# Patient Record
Sex: Female | Born: 1950 | Race: White | Hispanic: No | Marital: Married
Health system: Southern US, Community
[De-identification: ages and names within clinical notes are randomized; demographics above are authoritative.]

## PROBLEM LIST (undated history)

## (undated) HISTORY — PX: ABDOMINAL HYSTERECTOMY: SHX81

---

## 2004-04-08 HISTORY — PX: BREAST CYST ASPIRATION: SHX578

## 2005-08-16 ENCOUNTER — Ambulatory Visit: Payer: Self-pay | Admitting: Internal Medicine

## 2005-08-22 ENCOUNTER — Ambulatory Visit: Payer: Self-pay | Admitting: Internal Medicine

## 2007-03-26 ENCOUNTER — Ambulatory Visit: Payer: Self-pay | Admitting: Internal Medicine

## 2008-07-19 ENCOUNTER — Ambulatory Visit: Payer: Self-pay | Admitting: Internal Medicine

## 2009-07-20 ENCOUNTER — Ambulatory Visit: Payer: Self-pay | Admitting: Internal Medicine

## 2009-07-26 ENCOUNTER — Ambulatory Visit: Payer: Self-pay | Admitting: Internal Medicine

## 2009-10-12 ENCOUNTER — Ambulatory Visit: Payer: Self-pay | Admitting: Internal Medicine

## 2009-11-02 ENCOUNTER — Ambulatory Visit: Payer: Self-pay | Admitting: Vascular Surgery

## 2010-03-28 ENCOUNTER — Ambulatory Visit: Payer: Self-pay | Admitting: Vascular Surgery

## 2010-08-21 NOTE — Procedures (Signed)
CAROTID DUPLEX EXAM   INDICATION:  Followup known carotid artery disease.   HISTORY:  Diabetes:  No.  Cardiac:  No.  Hypertension:  Yes.  Smoking:  No.  Previous Surgery:  No.  CV History:  No.  Amaurosis Fugax No, Paresthesias No, Hemiparesis No                                       RIGHT             LEFT  Brachial systolic pressure:         175               170  Brachial Doppler waveforms:         WNL               WNL  Vertebral direction of flow:        Antegrade         Antegrade  DUPLEX VELOCITIES (cm/sec)  CCA peak systolic                   91                81  ECA peak systolic                   87                90  ICA peak systolic                   134 distal        182  ICA end diastolic                   47                76  PLAQUE MORPHOLOGY:                  Heterogeneous     Heterogeneous  PLAQUE AMOUNT:                      Mild              Moderate  PLAQUE LOCATION:                    ICA               ICA   IMPRESSION:  1. Right internal carotid artery suggests 40% to 59% stenosis.  2. Left internal carotid artery suggests 60% to 79% stenosis (low end      of range).  3. Antegrade flow in bilateral vertebrals.   ___________________________________________  Di Kindle. Edilia Bo, M.D.   CB/MEDQ  D:  11/02/2009  T:  11/03/2009  Job:  [id number]

## 2010-08-21 NOTE — Procedures (Signed)
CAROTID DUPLEX EXAM   INDICATION:  Follow up carotid artery disease.   HISTORY:  Diabetes:  No.  Cardiac:  No.  Hypertension:  Yes.  Smoking:  Yes.  Previous Surgery:  No.  CV History:  Currently asymptomatic.  Amaurosis Fugax No, Paresthesias No, Hemiparesis No.                                       RIGHT             LEFT  Brachial systolic pressure:         172               174  Brachial Doppler waveforms:         WNL               WNL  Vertebral direction of flow:        Antegrade         Antegrade  DUPLEX VELOCITIES (cm/sec)  CCA peak systolic                   83                71  ECA peak systolic                   104               99  ICA peak systolic                   66                126  ICA end diastolic                   21                45  PLAQUE MORPHOLOGY:                  Heterogenous      Heterogenous  PLAQUE AMOUNT:                      Mild              Mild  PLAQUE LOCATION:                    ICA               ICA, ECA   IMPRESSION:  1. Right side is no hemodynamically significant stenosis.  2. Left side is 40% to 59% stenosis in the internal carotid artery.  3. Bilateral intimal thickening within the common carotid arteries.  4. Please note that velocities not as elevated today as on previous      examinations.   ___________________________________________  Di Kindle. Edilia Bo, M.D.   OD/MEDQ  D:  03/28/2010  T:  03/28/2010  Job:  [id number]

## 2010-08-21 NOTE — Consult Note (Signed)
VASCULAR SURGERY CONSULTATION   Opdahl, Audrianna A.  DOB:  [birth year]                                       11/02/2009  [id number]#:[id number]   I saw the patient in the office today in consultation concerning her  carotid disease.  She was referred by Dr. Clearance Coots.  This is a pleasant 60-  year-old right-handed woman who on July 1 had the sudden onset of a  brief episode of dizziness and also of visual disturbance which she  describes as like her field of vision was vibrating.  These symptoms  lasted briefly.  She noted on July 2 she had an episode where she was  speaking on the phone and had some problem getting her words out.  She  has had no focal weakness or paresthesias.  She has had no history of  amaurosis fugax.  Her workup for this included a carotid duplex scan  done on 10/16/2009 which showed greater than 50% left carotid stenosis.  The vertebral arteries were patent with normally directed flow.  Subsequent MR of the brain showed a punctate area of high signal  intensity in the posterior medial right occipital lobe consistent with a  tiny acute infarct in this region.  She was sent for vascular  consultation.   Her past medical history is significant for hypercholesterolemia.  She  denies any history of diabetes, hypertension, history of previous  myocardial infarction, history of congestive heart failure, history of  COPD.  She has had migraines in the past.   PAST SURGICAL HISTORY:  Significant for partial hysterectomy and  cholecystectomy.   SOCIAL HISTORY:  She is married.  She has two children.  She smokes 1-  1/2 packs a day of cigarettes and has been smoking since she was 60  years old.   FAMILY HISTORY:  There is no history of premature cardiovascular  disease.   MEDICATIONS:  1. Her medications are aspirin 81 mg daily.  2. Simvastatin 20 mg p.o. daily.   REVIEW OF SYSTEMS:  GENERAL:  She has had no recent weight loss, weight  gain or  problems with her appetite.  CARDIOVASCULAR:  She has had no chest pain, chest pressure, palpitations  or arrhythmias.  She has had no history of DVT or phlebitis.  She has  had no history of claudication or rest pain.  NEUROLOGIC:  She has had the dizziness as described above and an  occasional headache.  PULMONARY:  She has had some wheezing in the past.  GI, hematologic, GU, ENT, musculoskeletal, psychiatric, integumentary  review of systems is unremarkable and is documented on the medical  history form in her chart.   PHYSICAL EXAMINATION:  General:  This is a pleasant 60 year old woman  who appears her stated age.  Vital signs:  Her blood pressure is 175/97,  heart rate is 69, respiratory rate is 20.  HEENT:  Unremarkable.  Lungs:  Are clear bilaterally to auscultation without rales, rhonchi or  wheezing.  Cardiovascular:  I do not detect any carotid bruits.  She has  a regular rate and rhythm.  She has palpable femoral pulses and warm and  well-perfused feet without ischemic ulcers.  Abdomen:  Soft and  nontender with normal pitched bowel sounds.  No masses are appreciated.  Musculoskeletal:  There are no major deformities or cyanosis.  Neurological:  She  has no focal weakness or paresthesias.  Skin:  There  are no ulcers or rashes.   I did order and independently interpret her carotid duplex scan which  shows a 40% to 59% right carotid stenosis in the distal right internal  carotid artery.  On the left side she has a 60% to 79% stenosis in the  low end of this range.  Her vertebral arteries are patent with normally  directed flow.   I have also reviewed her MRI which shows the findings described above  which are fairly subtle.   The carotid duplex scan really shows only moderate disease bilaterally.  It is possible that the small right occipital lobe stroke is related to  the mild right carotid stenosis and it is also possible that the  expressive aphasia is related to the  moderate left carotid stenosis.  The symptoms were somewhat subtle however and again the stenoses are not  especially tight.  I do not think I would recommend carotid  endarterectomy based on the information I have so far given that the  stenoses are not especially tight and the symptoms were subtle.  I think  the options are either observation and close followup with carotid  duplex scan and continuing her antiplatelet therapy versus proceeding  with a cerebral arteriogram to further assess her carotid disease.  We  have discussed this option.  She does understand there was a 1% risk of  stroke associated with cerebral arteriography but this would help [place]  further evaluate the stenoses.  I do not think a CTA would give adequate  detail.  I have recommended that we obtain a followup carotid duplex  scan in 6 months and only consider carotid endarterectomy if the  stenosis progressed or if she develops new neurologic symptoms.  We have  reviewed the potential symptoms of cerebrovascular disease.  We also  discussed potentially taking Plavix, although I think there was some  concern about the cost for this.  She will take 81 mg of aspirin twice a  day and I will plan on seeing her back in 6 months.  I have ordered her  carotid duplex scan at that time.  If she has any new symptoms she will  let us know.     Di Kindle. Edilia Bo, M.D.  Electronically Signed  CSD/MEDQ  D:  11/02/2009  T:  11/03/2009  Job:  [id number]   cc:   Diona Fanti

## 2010-08-21 NOTE — Assessment & Plan Note (Signed)
OFFICE VISIT   Allison Andrews, Allison Andrews  DOB:  [birth year]                                       03/28/2010  WGNFA#:[id number]   I saw this patient in the office today for continued followup of her  carotid disease.  I had originally seen her in consultation on November 02, 2009.  She had Andrews brief episode on July 1 of Andrews visual disturbance, but  this did not sound like classic amaurosis fugax.  Another episode the  following day with Andrews brief episode of expressive aphasia.  Her carotid  duplex scan in July suggested Andrews moderate 60%-79% left carotid stenosis  in the lower end of that range and Andrews 40%-59% right carotid stenosis.  She had an MRI of the brain which showed Andrews tiny acute infarct in the  medial right occipital lobe.  At that point, given that she had only  moderate disease and her symptoms were not classic for hemispheric  symptoms, I have recommended Andrews follow-up duplex scan in 6 months.  Since  I saw her last, she has had no history of stroke, TIAs, expressive or  receptive aphasia, or amaurosis fugax.  There has been no significant  change in her medical history which is significant only for  hypercholesterolemia.   SOCIAL HISTORY:  She does continue to smoke Andrews pack per day of cigarettes  and she has been smoking since she was 14.   REVIEW OF SYSTEMS:  CARDIOVASCULAR:  She had no chest pain, chest  pressure, palpitations or arrhythmias.  PULMONARY:  She had no productive cough, bronchitis, asthma or wheezing.   PHYSICAL EXAMINATION:  General:  This is Andrews pleasant 60 year old woman  who appears her stated age.  Vital signs:  Her blood pressure is 157/80,  heart rate is 65, saturation 98%.  Lungs:  Clear bilaterally to  auscultation.  Cardiovascular:  I do not detect any carotid bruits.  She  has Andrews regular rate and rhythm.  Both feet are warm and well-perfused  without ischemic ulcers.  She has no significant lower extremity  swelling.  Abdomen:  Soft and  nontender with normal-pitched bowel  sounds.  Neurologic:  She has no focal weakness or paresthesias.   I have independently interpreted her carotid duplex scan which shows  that the stenoses have actually improved bilaterally.  She has Andrews less  than 39% stenosis on the right and 40%-59% left carotid stenosis.  Both  vertebral arteries are patent with normally directed flow and arm  pressures are equal.  I have compared this study to Andrews previous study  which again shows significantly improved velocities.   Given that she is asymptomatic and her velocities are improved, I think  it is safe to extend her followup out to 1 year.  I have ordered Andrews  follow-up carotid duplex scan in 1 year and I will see her back at that  time.  She knows to call sooner if she has problems.  In the meantime,  she knows to continue taking her aspirin.  In addition, we have again  discussed the importance of tobacco cessation.  However, she has been  under Andrews lot of stress at this point and has not had any success with  this.     Di Kindle. Edilia Bo, M.D.  Electronically Signed   CSD/MEDQ  D:  03/28/2010  T:  03/29/2010  Job:  [id number]   cc:   Diona Fanti

## 2012-02-11 ENCOUNTER — Encounter: Payer: Self-pay | Admitting: Vascular Surgery

## 2013-01-21 ENCOUNTER — Ambulatory Visit: Payer: Self-pay | Admitting: Family Medicine

## 2013-01-29 ENCOUNTER — Ambulatory Visit: Payer: Self-pay | Admitting: Family Medicine

## 2013-08-03 ENCOUNTER — Ambulatory Visit: Payer: Self-pay | Admitting: Family Medicine

## 2014-02-07 ENCOUNTER — Ambulatory Visit: Payer: Self-pay | Admitting: Family Medicine

## 2014-07-22 ENCOUNTER — Other Ambulatory Visit: Payer: Self-pay | Admitting: Family Medicine

## 2014-07-22 DIAGNOSIS — N63 Unspecified lump in unspecified breast: Secondary | ICD-10-CM

## 2014-08-11 ENCOUNTER — Ambulatory Visit: Payer: Self-pay

## 2014-08-11 ENCOUNTER — Ambulatory Visit
Admission: RE | Admit: 2014-08-11 | Discharge: 2014-08-11 | Disposition: A | Discharge status: Home / Self Care | Payer: PPO | Source: Ambulatory Visit | Attending: Family Medicine | Admitting: Family Medicine

## 2014-08-11 DIAGNOSIS — N63 Unspecified lump in unspecified breast: Secondary | ICD-10-CM

## 2014-08-11 DIAGNOSIS — R921 Mammographic calcification found on diagnostic imaging of breast: Secondary | ICD-10-CM | POA: Diagnosis not present

## 2015-02-23 ENCOUNTER — Other Ambulatory Visit: Payer: Self-pay | Admitting: Family Medicine

## 2015-02-23 DIAGNOSIS — Z1231 Encounter for screening mammogram for malignant neoplasm of breast: Secondary | ICD-10-CM

## 2015-03-07 ENCOUNTER — Ambulatory Visit
Admission: RE | Admit: 2015-03-07 | Discharge: 2015-03-07 | Disposition: A | Discharge status: Home / Self Care | Payer: PPO | Source: Ambulatory Visit | Attending: Family Medicine | Admitting: Family Medicine

## 2015-03-07 DIAGNOSIS — Z1231 Encounter for screening mammogram for malignant neoplasm of breast: Secondary | ICD-10-CM

## 2015-05-18 DIAGNOSIS — E119 Type 2 diabetes mellitus without complications: Secondary | ICD-10-CM | POA: Diagnosis not present

## 2015-05-18 DIAGNOSIS — H40219 Acute angle-closure glaucoma, unspecified eye: Secondary | ICD-10-CM | POA: Diagnosis not present

## 2015-05-18 DIAGNOSIS — H43399 Other vitreous opacities, unspecified eye: Secondary | ICD-10-CM | POA: Diagnosis not present

## 2015-05-18 DIAGNOSIS — H4011X0 Primary open-angle glaucoma, stage unspecified: Secondary | ICD-10-CM | POA: Diagnosis not present

## 2015-06-16 DIAGNOSIS — E119 Type 2 diabetes mellitus without complications: Secondary | ICD-10-CM | POA: Diagnosis not present

## 2015-06-16 DIAGNOSIS — E782 Mixed hyperlipidemia: Secondary | ICD-10-CM | POA: Diagnosis not present

## 2015-06-21 DIAGNOSIS — H4011X0 Primary open-angle glaucoma, stage unspecified: Secondary | ICD-10-CM | POA: Diagnosis not present

## 2015-06-21 DIAGNOSIS — H40013 Open angle with borderline findings, low risk, bilateral: Secondary | ICD-10-CM | POA: Diagnosis not present

## 2015-06-23 DIAGNOSIS — E119 Type 2 diabetes mellitus without complications: Secondary | ICD-10-CM | POA: Diagnosis not present

## 2015-06-23 DIAGNOSIS — E782 Mixed hyperlipidemia: Secondary | ICD-10-CM | POA: Diagnosis not present

## 2015-06-23 DIAGNOSIS — E6609 Other obesity due to excess calories: Secondary | ICD-10-CM | POA: Diagnosis not present

## 2015-06-23 DIAGNOSIS — M8580 Other specified disorders of bone density and structure, unspecified site: Secondary | ICD-10-CM | POA: Diagnosis not present

## 2015-07-05 DIAGNOSIS — H4011X0 Primary open-angle glaucoma, stage unspecified: Secondary | ICD-10-CM | POA: Diagnosis not present

## 2015-09-20 DIAGNOSIS — H4011X0 Primary open-angle glaucoma, stage unspecified: Secondary | ICD-10-CM | POA: Diagnosis not present

## 2015-10-16 DIAGNOSIS — H40033 Anatomical narrow angle, bilateral: Secondary | ICD-10-CM | POA: Diagnosis not present

## 2015-10-16 DIAGNOSIS — E782 Mixed hyperlipidemia: Secondary | ICD-10-CM | POA: Diagnosis not present

## 2015-10-16 DIAGNOSIS — E119 Type 2 diabetes mellitus without complications: Secondary | ICD-10-CM | POA: Diagnosis not present

## 2015-10-23 DIAGNOSIS — E782 Mixed hyperlipidemia: Secondary | ICD-10-CM | POA: Diagnosis not present

## 2015-10-23 DIAGNOSIS — E119 Type 2 diabetes mellitus without complications: Secondary | ICD-10-CM | POA: Diagnosis not present

## 2015-10-23 DIAGNOSIS — M8580 Other specified disorders of bone density and structure, unspecified site: Secondary | ICD-10-CM | POA: Diagnosis not present

## 2015-10-23 DIAGNOSIS — E6609 Other obesity due to excess calories: Secondary | ICD-10-CM | POA: Diagnosis not present

## 2015-11-13 DIAGNOSIS — H40033 Anatomical narrow angle, bilateral: Secondary | ICD-10-CM | POA: Diagnosis not present

## 2015-11-13 DIAGNOSIS — H40013 Open angle with borderline findings, low risk, bilateral: Secondary | ICD-10-CM | POA: Diagnosis not present

## 2016-01-24 ENCOUNTER — Other Ambulatory Visit: Payer: Self-pay | Admitting: Family Medicine

## 2016-02-13 DIAGNOSIS — H40013 Open angle with borderline findings, low risk, bilateral: Secondary | ICD-10-CM | POA: Diagnosis not present

## 2016-04-12 DIAGNOSIS — E782 Mixed hyperlipidemia: Secondary | ICD-10-CM | POA: Diagnosis not present

## 2016-04-12 DIAGNOSIS — E119 Type 2 diabetes mellitus without complications: Secondary | ICD-10-CM | POA: Diagnosis not present

## 2016-04-19 DIAGNOSIS — E119 Type 2 diabetes mellitus without complications: Secondary | ICD-10-CM | POA: Diagnosis not present

## 2016-04-19 DIAGNOSIS — E6609 Other obesity due to excess calories: Secondary | ICD-10-CM | POA: Diagnosis not present

## 2016-04-19 DIAGNOSIS — G8929 Other chronic pain: Secondary | ICD-10-CM | POA: Diagnosis not present

## 2016-04-19 DIAGNOSIS — E782 Mixed hyperlipidemia: Secondary | ICD-10-CM | POA: Diagnosis not present

## 2016-04-19 DIAGNOSIS — M545 Low back pain: Secondary | ICD-10-CM | POA: Diagnosis not present

## 2016-04-19 DIAGNOSIS — Z0000 Encounter for general adult medical examination without abnormal findings: Secondary | ICD-10-CM | POA: Diagnosis not present

## 2016-04-29 ENCOUNTER — Other Ambulatory Visit: Payer: Self-pay | Admitting: Family Medicine

## 2016-04-29 DIAGNOSIS — Z1231 Encounter for screening mammogram for malignant neoplasm of breast: Secondary | ICD-10-CM

## 2016-05-20 ENCOUNTER — Ambulatory Visit
Admission: RE | Admit: 2016-05-20 | Discharge: 2016-05-20 | Disposition: A | Discharge status: Home / Self Care | Payer: PPO | Source: Ambulatory Visit | Attending: Family Medicine | Admitting: Family Medicine

## 2016-05-20 DIAGNOSIS — Z1231 Encounter for screening mammogram for malignant neoplasm of breast: Secondary | ICD-10-CM

## 2016-05-23 DIAGNOSIS — E119 Type 2 diabetes mellitus without complications: Secondary | ICD-10-CM | POA: Diagnosis not present

## 2016-05-23 DIAGNOSIS — H43399 Other vitreous opacities, unspecified eye: Secondary | ICD-10-CM | POA: Diagnosis not present

## 2016-05-23 DIAGNOSIS — H5203 Hypermetropia, bilateral: Secondary | ICD-10-CM | POA: Diagnosis not present

## 2016-08-23 DIAGNOSIS — L509 Urticaria, unspecified: Secondary | ICD-10-CM | POA: Diagnosis not present

## 2016-11-01 DIAGNOSIS — E119 Type 2 diabetes mellitus without complications: Secondary | ICD-10-CM | POA: Diagnosis not present

## 2016-11-01 DIAGNOSIS — E785 Hyperlipidemia, unspecified: Secondary | ICD-10-CM | POA: Diagnosis not present

## 2016-11-08 DIAGNOSIS — R7303 Prediabetes: Secondary | ICD-10-CM | POA: Diagnosis not present

## 2016-11-08 DIAGNOSIS — E782 Mixed hyperlipidemia: Secondary | ICD-10-CM | POA: Diagnosis not present

## 2016-11-08 DIAGNOSIS — E6609 Other obesity due to excess calories: Secondary | ICD-10-CM | POA: Diagnosis not present

## 2016-12-05 DIAGNOSIS — H43813 Vitreous degeneration, bilateral: Secondary | ICD-10-CM | POA: Diagnosis not present

## 2016-12-05 DIAGNOSIS — E119 Type 2 diabetes mellitus without complications: Secondary | ICD-10-CM | POA: Diagnosis not present

## 2017-01-16 DIAGNOSIS — Z0000 Encounter for general adult medical examination without abnormal findings: Secondary | ICD-10-CM | POA: Diagnosis not present

## 2017-03-06 DIAGNOSIS — H43313 Vitreous membranes and strands, bilateral: Secondary | ICD-10-CM | POA: Diagnosis not present

## 2017-04-17 ENCOUNTER — Other Ambulatory Visit: Payer: Self-pay | Admitting: Family Medicine

## 2017-04-17 DIAGNOSIS — Z1231 Encounter for screening mammogram for malignant neoplasm of breast: Secondary | ICD-10-CM

## 2017-05-08 DIAGNOSIS — R7303 Prediabetes: Secondary | ICD-10-CM | POA: Diagnosis not present

## 2017-05-08 DIAGNOSIS — E782 Mixed hyperlipidemia: Secondary | ICD-10-CM | POA: Diagnosis not present

## 2017-05-22 DIAGNOSIS — Z0000 Encounter for general adult medical examination without abnormal findings: Secondary | ICD-10-CM | POA: Diagnosis not present

## 2017-05-22 DIAGNOSIS — I10 Essential (primary) hypertension: Secondary | ICD-10-CM | POA: Diagnosis not present

## 2017-05-22 DIAGNOSIS — E782 Mixed hyperlipidemia: Secondary | ICD-10-CM | POA: Diagnosis not present

## 2017-05-22 DIAGNOSIS — R7303 Prediabetes: Secondary | ICD-10-CM | POA: Diagnosis not present

## 2017-05-22 DIAGNOSIS — E6609 Other obesity due to excess calories: Secondary | ICD-10-CM | POA: Diagnosis not present

## 2017-05-29 ENCOUNTER — Ambulatory Visit
Admission: RE | Admit: 2017-05-29 | Discharge: 2017-05-29 | Disposition: A | Discharge status: Home / Self Care | Payer: PPO | Source: Ambulatory Visit | Attending: Family Medicine | Admitting: Family Medicine

## 2017-05-29 DIAGNOSIS — Z1231 Encounter for screening mammogram for malignant neoplasm of breast: Secondary | ICD-10-CM | POA: Diagnosis not present

## 2017-06-05 DIAGNOSIS — H4011X0 Primary open-angle glaucoma, stage unspecified: Secondary | ICD-10-CM | POA: Diagnosis not present

## 2017-06-05 DIAGNOSIS — H43399 Other vitreous opacities, unspecified eye: Secondary | ICD-10-CM | POA: Diagnosis not present

## 2017-06-05 DIAGNOSIS — E119 Type 2 diabetes mellitus without complications: Secondary | ICD-10-CM | POA: Diagnosis not present

## 2017-09-04 DIAGNOSIS — H40023 Open angle with borderline findings, high risk, bilateral: Secondary | ICD-10-CM | POA: Diagnosis not present

## 2017-11-12 DIAGNOSIS — R7303 Prediabetes: Secondary | ICD-10-CM | POA: Diagnosis not present

## 2017-11-12 DIAGNOSIS — I10 Essential (primary) hypertension: Secondary | ICD-10-CM | POA: Diagnosis not present

## 2017-11-12 DIAGNOSIS — E782 Mixed hyperlipidemia: Secondary | ICD-10-CM | POA: Diagnosis not present

## 2017-11-19 DIAGNOSIS — R001 Bradycardia, unspecified: Secondary | ICD-10-CM | POA: Diagnosis not present

## 2017-11-19 DIAGNOSIS — I10 Essential (primary) hypertension: Secondary | ICD-10-CM | POA: Diagnosis not present

## 2017-11-19 DIAGNOSIS — E782 Mixed hyperlipidemia: Secondary | ICD-10-CM | POA: Diagnosis not present

## 2017-11-19 DIAGNOSIS — R7303 Prediabetes: Secondary | ICD-10-CM | POA: Diagnosis not present

## 2017-11-19 DIAGNOSIS — E6609 Other obesity due to excess calories: Secondary | ICD-10-CM | POA: Diagnosis not present

## 2017-12-24 DIAGNOSIS — H40003 Preglaucoma, unspecified, bilateral: Secondary | ICD-10-CM | POA: Diagnosis not present

## 2018-03-25 DIAGNOSIS — H40013 Open angle with borderline findings, low risk, bilateral: Secondary | ICD-10-CM | POA: Diagnosis not present

## 2018-05-06 ENCOUNTER — Other Ambulatory Visit: Payer: Self-pay | Admitting: Family Medicine

## 2018-05-06 DIAGNOSIS — Z1231 Encounter for screening mammogram for malignant neoplasm of breast: Secondary | ICD-10-CM

## 2018-06-01 DIAGNOSIS — R7303 Prediabetes: Secondary | ICD-10-CM | POA: Diagnosis not present

## 2018-06-01 DIAGNOSIS — E782 Mixed hyperlipidemia: Secondary | ICD-10-CM | POA: Diagnosis not present

## 2018-06-01 DIAGNOSIS — I10 Essential (primary) hypertension: Secondary | ICD-10-CM | POA: Diagnosis not present

## 2018-06-03 ENCOUNTER — Ambulatory Visit
Admission: RE | Admit: 2018-06-03 | Discharge: 2018-06-03 | Disposition: A | Discharge status: Home / Self Care | Payer: PPO | Source: Ambulatory Visit | Attending: Family Medicine | Admitting: Family Medicine

## 2018-06-03 DIAGNOSIS — Z1231 Encounter for screening mammogram for malignant neoplasm of breast: Secondary | ICD-10-CM | POA: Insufficient documentation

## 2018-07-02 DIAGNOSIS — I10 Essential (primary) hypertension: Secondary | ICD-10-CM | POA: Diagnosis not present

## 2018-07-02 DIAGNOSIS — E6609 Other obesity due to excess calories: Secondary | ICD-10-CM | POA: Diagnosis not present

## 2018-07-02 DIAGNOSIS — E782 Mixed hyperlipidemia: Secondary | ICD-10-CM | POA: Diagnosis not present

## 2018-07-02 DIAGNOSIS — R7303 Prediabetes: Secondary | ICD-10-CM | POA: Diagnosis not present

## 2018-07-02 DIAGNOSIS — Z0000 Encounter for general adult medical examination without abnormal findings: Secondary | ICD-10-CM | POA: Diagnosis not present

## 2018-09-16 DIAGNOSIS — H40003 Preglaucoma, unspecified, bilateral: Secondary | ICD-10-CM | POA: Diagnosis not present

## 2018-12-28 DIAGNOSIS — I10 Essential (primary) hypertension: Secondary | ICD-10-CM | POA: Diagnosis not present

## 2018-12-28 DIAGNOSIS — E782 Mixed hyperlipidemia: Secondary | ICD-10-CM | POA: Diagnosis not present

## 2018-12-28 DIAGNOSIS — R7303 Prediabetes: Secondary | ICD-10-CM | POA: Diagnosis not present

## 2019-01-04 DIAGNOSIS — I10 Essential (primary) hypertension: Secondary | ICD-10-CM | POA: Diagnosis not present

## 2019-01-04 DIAGNOSIS — E6609 Other obesity due to excess calories: Secondary | ICD-10-CM | POA: Diagnosis not present

## 2019-01-04 DIAGNOSIS — E782 Mixed hyperlipidemia: Secondary | ICD-10-CM | POA: Diagnosis not present

## 2019-03-18 DIAGNOSIS — H40013 Open angle with borderline findings, low risk, bilateral: Secondary | ICD-10-CM | POA: Diagnosis not present

## 2019-04-13 DIAGNOSIS — R509 Fever, unspecified: Secondary | ICD-10-CM | POA: Diagnosis not present

## 2019-04-13 DIAGNOSIS — R0789 Other chest pain: Secondary | ICD-10-CM | POA: Diagnosis not present

## 2019-04-13 DIAGNOSIS — J028 Acute pharyngitis due to other specified organisms: Secondary | ICD-10-CM | POA: Diagnosis not present

## 2019-04-13 DIAGNOSIS — B9789 Other viral agents as the cause of diseases classified elsewhere: Secondary | ICD-10-CM | POA: Diagnosis not present

## 2019-04-19 DIAGNOSIS — L509 Urticaria, unspecified: Secondary | ICD-10-CM | POA: Diagnosis not present

## 2019-05-03 DIAGNOSIS — I10 Essential (primary) hypertension: Secondary | ICD-10-CM | POA: Diagnosis not present

## 2019-05-03 DIAGNOSIS — E782 Mixed hyperlipidemia: Secondary | ICD-10-CM | POA: Diagnosis not present

## 2019-05-10 DIAGNOSIS — I10 Essential (primary) hypertension: Secondary | ICD-10-CM | POA: Diagnosis not present

## 2019-05-10 DIAGNOSIS — M654 Radial styloid tenosynovitis [de Quervain]: Secondary | ICD-10-CM | POA: Diagnosis not present

## 2019-05-10 DIAGNOSIS — E782 Mixed hyperlipidemia: Secondary | ICD-10-CM | POA: Diagnosis not present

## 2019-05-10 DIAGNOSIS — E6609 Other obesity due to excess calories: Secondary | ICD-10-CM | POA: Diagnosis not present

## 2019-05-17 ENCOUNTER — Other Ambulatory Visit: Payer: Self-pay | Admitting: Family Medicine

## 2019-05-17 DIAGNOSIS — M654 Radial styloid tenosynovitis [de Quervain]: Secondary | ICD-10-CM | POA: Diagnosis not present

## 2019-05-17 DIAGNOSIS — G8929 Other chronic pain: Secondary | ICD-10-CM | POA: Diagnosis not present

## 2019-05-17 DIAGNOSIS — Z1231 Encounter for screening mammogram for malignant neoplasm of breast: Secondary | ICD-10-CM

## 2019-05-17 DIAGNOSIS — M25532 Pain in left wrist: Secondary | ICD-10-CM | POA: Diagnosis not present

## 2019-06-14 ENCOUNTER — Ambulatory Visit
Admission: RE | Admit: 2019-06-14 | Discharge: 2019-06-14 | Disposition: A | Discharge status: Home / Self Care | Payer: PPO | Source: Ambulatory Visit | Attending: Family Medicine | Admitting: Family Medicine

## 2019-06-14 DIAGNOSIS — Z1231 Encounter for screening mammogram for malignant neoplasm of breast: Secondary | ICD-10-CM | POA: Diagnosis not present

## 2019-07-19 DIAGNOSIS — H40013 Open angle with borderline findings, low risk, bilateral: Secondary | ICD-10-CM | POA: Diagnosis not present

## 2019-09-08 DIAGNOSIS — I10 Essential (primary) hypertension: Secondary | ICD-10-CM | POA: Diagnosis not present

## 2019-09-08 DIAGNOSIS — E782 Mixed hyperlipidemia: Secondary | ICD-10-CM | POA: Diagnosis not present

## 2019-09-13 DIAGNOSIS — I10 Essential (primary) hypertension: Secondary | ICD-10-CM | POA: Diagnosis not present

## 2019-09-13 DIAGNOSIS — E6609 Other obesity due to excess calories: Secondary | ICD-10-CM | POA: Diagnosis not present

## 2019-09-13 DIAGNOSIS — Z0000 Encounter for general adult medical examination without abnormal findings: Secondary | ICD-10-CM | POA: Diagnosis not present

## 2019-09-13 DIAGNOSIS — E782 Mixed hyperlipidemia: Secondary | ICD-10-CM | POA: Diagnosis not present

## 2019-09-20 ENCOUNTER — Telehealth: Payer: Self-pay | Admitting: *Deleted

## 2019-09-20 DIAGNOSIS — Z87891 Personal history of nicotine dependence: Secondary | ICD-10-CM

## 2019-09-20 DIAGNOSIS — Z122 Encounter for screening for malignant neoplasm of respiratory organs: Secondary | ICD-10-CM

## 2019-09-20 NOTE — Telephone Encounter (Signed)
Received referral for initial lung cancer screening scan. Contacted patient and obtained smoking history,(former, quit 2013, 92 pack year) as well as answering questions related to screening process. Patient denies signs of lung cancer such as weight loss or hemoptysis. Patient denies comorbidity that would prevent curative treatment if lung cancer were found. Patient is scheduled for shared decision making visit and CT scan on 10/04/19.

## 2019-09-21 DIAGNOSIS — M545 Low back pain: Secondary | ICD-10-CM | POA: Diagnosis not present

## 2019-09-24 DIAGNOSIS — I10 Essential (primary) hypertension: Secondary | ICD-10-CM | POA: Diagnosis not present

## 2019-09-24 DIAGNOSIS — M545 Low back pain: Secondary | ICD-10-CM | POA: Diagnosis not present

## 2019-10-04 ENCOUNTER — Ambulatory Visit
Admission: RE | Admit: 2019-10-04 | Discharge: 2019-10-04 | Disposition: A | Discharge status: Home / Self Care | Payer: PPO | Source: Ambulatory Visit | Attending: Oncology | Admitting: Oncology

## 2019-10-04 ENCOUNTER — Other Ambulatory Visit: Payer: Self-pay

## 2019-10-04 ENCOUNTER — Inpatient Hospital Stay: Payer: PPO | Attending: Oncology | Admitting: Oncology

## 2019-10-04 DIAGNOSIS — Z87891 Personal history of nicotine dependence: Secondary | ICD-10-CM

## 2019-10-04 DIAGNOSIS — Z122 Encounter for screening for malignant neoplasm of respiratory organs: Secondary | ICD-10-CM

## 2019-10-04 NOTE — Progress Notes (Signed)
Virtual Visit via Video Note  I connected with Allison Andrews on 10/04/19 at  1:00 PM EDT by a video enabled telemedicine application and verified that I am speaking with the correct person using two identifiers.  Location: Patient: Home Provider: Clinic   I discussed the limitations of evaluation and management by telemedicine and the availability of in person appointments. The patient expressed understanding and agreed to proceed.  I discussed the assessment and treatment plan with the patient. The patient was provided an opportunity to ask questions and all were answered. The patient agreed with the plan and demonstrated an understanding of the instructions.   The patient was advised to call back or seek an in-person evaluation if the symptoms worsen or if the condition fails to improve as anticipated.   In accordance with CMS guidelines, patient has met eligibility criteria including age, absence of signs or symptoms of lung cancer.  Social History   Tobacco Use  . Smoking status: Not on file  Substance Use Topics  . Alcohol use: Not on file  . Drug use: Not on file      A shared decision-making session was conducted prior to the performance of CT scan. This includes one or more decision aids, includes benefits and harms of screening, follow-up diagnostic testing, over-diagnosis, false positive rate, and total radiation exposure.   Counseling on the importance of adherence to annual lung cancer LDCT screening, impact of co-morbidities, and ability or willingness to undergo diagnosis and treatment is imperative for compliance of the program.   Counseling on the importance of continued smoking cessation for former smokers; the importance of smoking cessation for current smokers, and information about tobacco cessation interventions have been given to patient including [place] and 1800 quit [place] programs.   Written order for lung cancer screening with LDCT has been given to  the patient and any and all questions have been answered to the best of my abilities.    Yearly follow up will be coordinated by Burgess Estelle, Thoracic Navigator.  I provided 15 minutes of face-to-face video visit time during this encounter, and > 50% was spent counseling as documented under my assessment & plan.   Jacquelin Hawking, NP

## 2019-10-06 ENCOUNTER — Telehealth: Payer: Self-pay | Admitting: *Deleted

## 2019-10-06 NOTE — Telephone Encounter (Signed)
Notified patient of LDCT lung cancer screening program results with recommendation for 3 month follow up imaging. Also notified of incidental findings noted below and is encouraged to discuss further with PCP who will receive a copy of this note and/or the CT report. Patient verbalizes understanding.   IMPRESSION: 1. Lung-RADS 4AS, suspicious. Follow up low-dose chest CT without contrast in 3 months (please use the following order, "CT CHEST LCS NODULE FOLLOW-UP W/O CM") is recommended. Alternatively, PET may be considered when there is a solid component 14mm or larger. 2. The "S" modifier above refers to potentially clinically significant non lung cancer related findings. Specifically, there is aortic atherosclerosis, in addition to left main coronary artery disease. Assessment for potential risk factor modification, dietary therapy or pharmacologic therapy may be warranted, if clinically indicated. 3. Mild diffuse bronchial wall thickening with mild centrilobular and paraseptal emphysema; imaging findings suggestive of underlying COPD. 4. Small amount of pericardial fluid and/or thickening, unlikely to be of hemodynamic significance at this time.  Aortic Atherosclerosis (ICD10-I70.0) and Emphysema (ICD10-J43.9).

## 2019-10-14 DIAGNOSIS — I700 Atherosclerosis of aorta: Secondary | ICD-10-CM | POA: Diagnosis not present

## 2019-10-14 DIAGNOSIS — I2510 Atherosclerotic heart disease of native coronary artery without angina pectoris: Secondary | ICD-10-CM | POA: Diagnosis not present

## 2019-10-14 DIAGNOSIS — J438 Other emphysema: Secondary | ICD-10-CM | POA: Diagnosis not present

## 2019-12-27 ENCOUNTER — Other Ambulatory Visit: Payer: Self-pay | Admitting: *Deleted

## 2019-12-27 DIAGNOSIS — Z87891 Personal history of nicotine dependence: Secondary | ICD-10-CM

## 2019-12-27 DIAGNOSIS — R918 Other nonspecific abnormal finding of lung field: Secondary | ICD-10-CM

## 2019-12-27 NOTE — Progress Notes (Signed)
Contacted and scheduled 

## 2019-12-29 DIAGNOSIS — M545 Low back pain: Secondary | ICD-10-CM | POA: Diagnosis not present

## 2020-01-04 DIAGNOSIS — M545 Low back pain: Secondary | ICD-10-CM | POA: Diagnosis not present

## 2020-01-10 ENCOUNTER — Other Ambulatory Visit: Payer: Self-pay

## 2020-01-10 ENCOUNTER — Ambulatory Visit
Admission: RE | Admit: 2020-01-10 | Discharge: 2020-01-10 | Disposition: A | Discharge status: Home / Self Care | Payer: PPO | Source: Ambulatory Visit | Attending: Oncology | Admitting: Oncology

## 2020-01-10 DIAGNOSIS — I2510 Atherosclerotic heart disease of native coronary artery without angina pectoris: Secondary | ICD-10-CM | POA: Diagnosis not present

## 2020-01-10 DIAGNOSIS — R918 Other nonspecific abnormal finding of lung field: Secondary | ICD-10-CM | POA: Diagnosis not present

## 2020-01-10 DIAGNOSIS — I313 Pericardial effusion (noninflammatory): Secondary | ICD-10-CM | POA: Diagnosis not present

## 2020-01-10 DIAGNOSIS — J432 Centrilobular emphysema: Secondary | ICD-10-CM | POA: Diagnosis not present

## 2020-01-10 DIAGNOSIS — Z87891 Personal history of nicotine dependence: Secondary | ICD-10-CM

## 2020-01-10 DIAGNOSIS — I700 Atherosclerosis of aorta: Secondary | ICD-10-CM | POA: Diagnosis not present

## 2020-01-12 ENCOUNTER — Encounter: Payer: Self-pay | Admitting: *Deleted

## 2020-01-31 DIAGNOSIS — H4011X0 Primary open-angle glaucoma, stage unspecified: Secondary | ICD-10-CM | POA: Diagnosis not present

## 2020-01-31 DIAGNOSIS — H40013 Open angle with borderline findings, low risk, bilateral: Secondary | ICD-10-CM | POA: Diagnosis not present

## 2020-01-31 DIAGNOSIS — H40003 Preglaucoma, unspecified, bilateral: Secondary | ICD-10-CM | POA: Diagnosis not present

## 2020-02-24 IMAGING — MG DIGITAL SCREENING BILATERAL MAMMOGRAM WITH TOMO AND CAD
8 series · 8 of 24 positions shown · non-contrast
Comparison: Previous exam(s).

CLINICAL DATA: Screening.

EXAM:
DIGITAL SCREENING BILATERAL MAMMOGRAM WITH TOMO AND CAD

[L CC synth-2D]
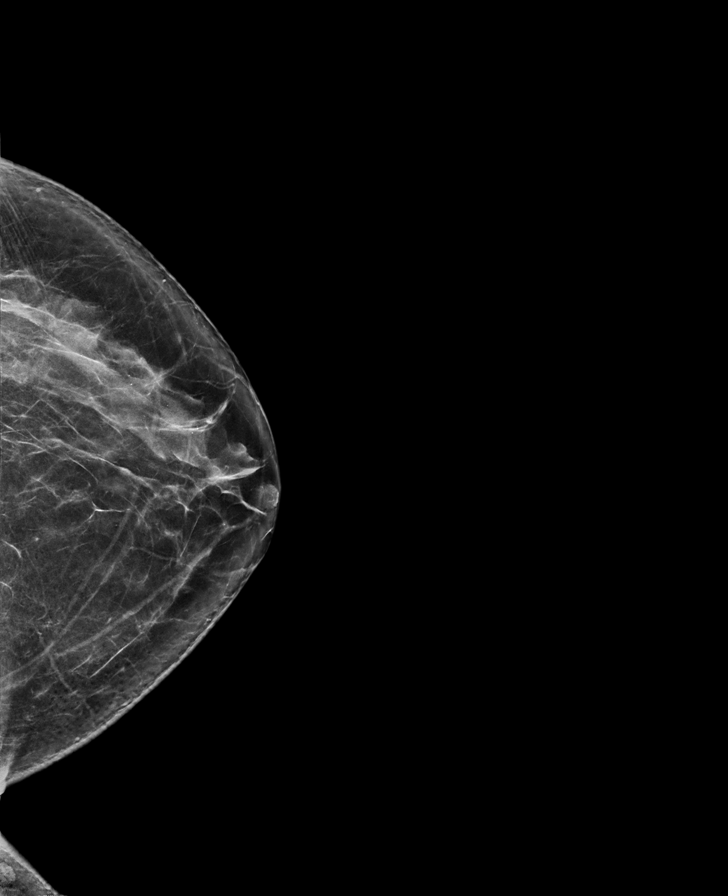

[R MLO synth-2D]
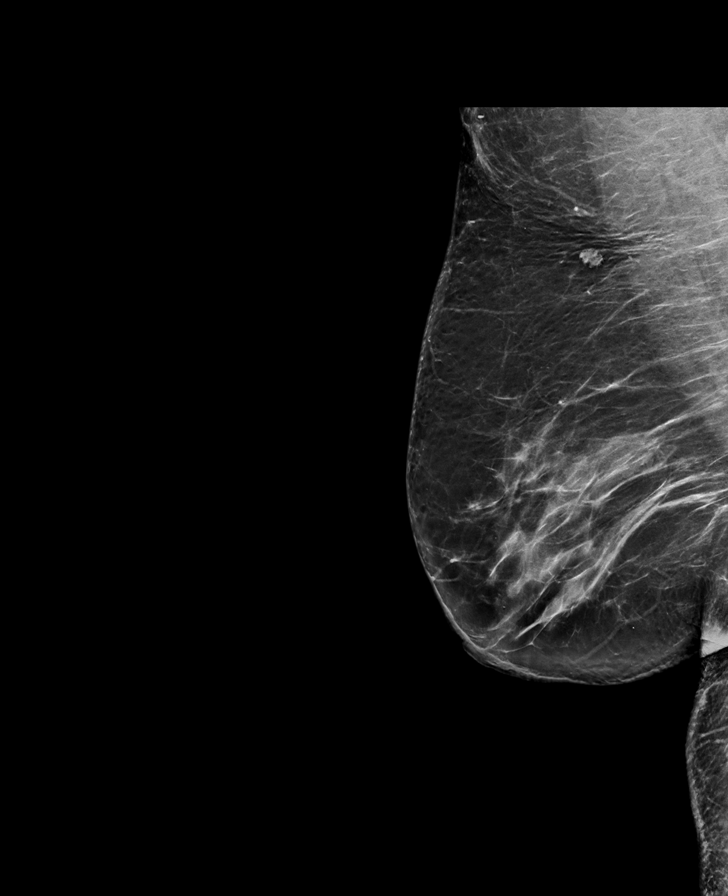

[L MLO synth-2D]
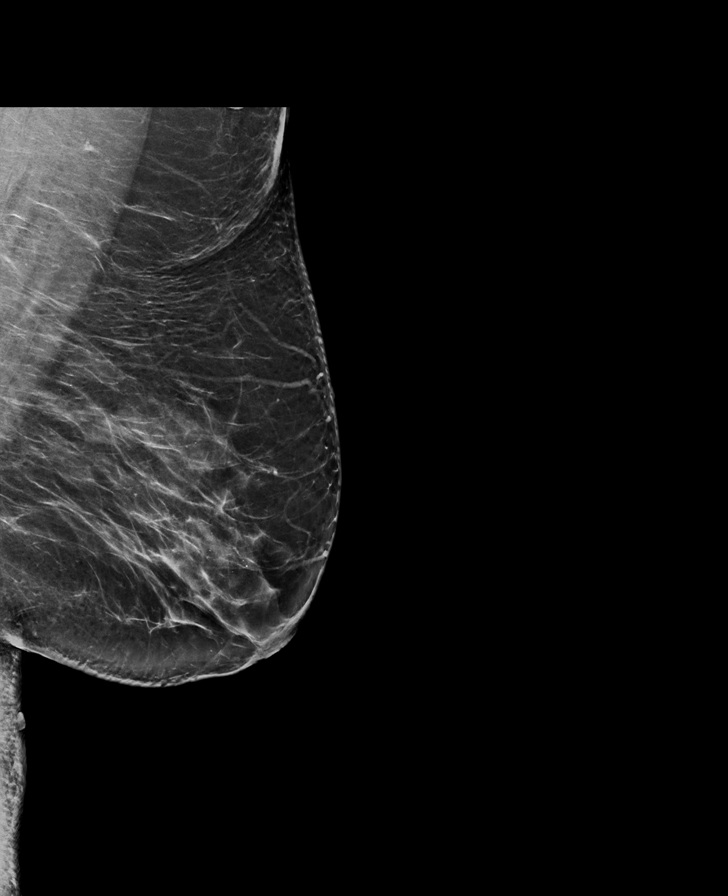

[R CC synth-2D]
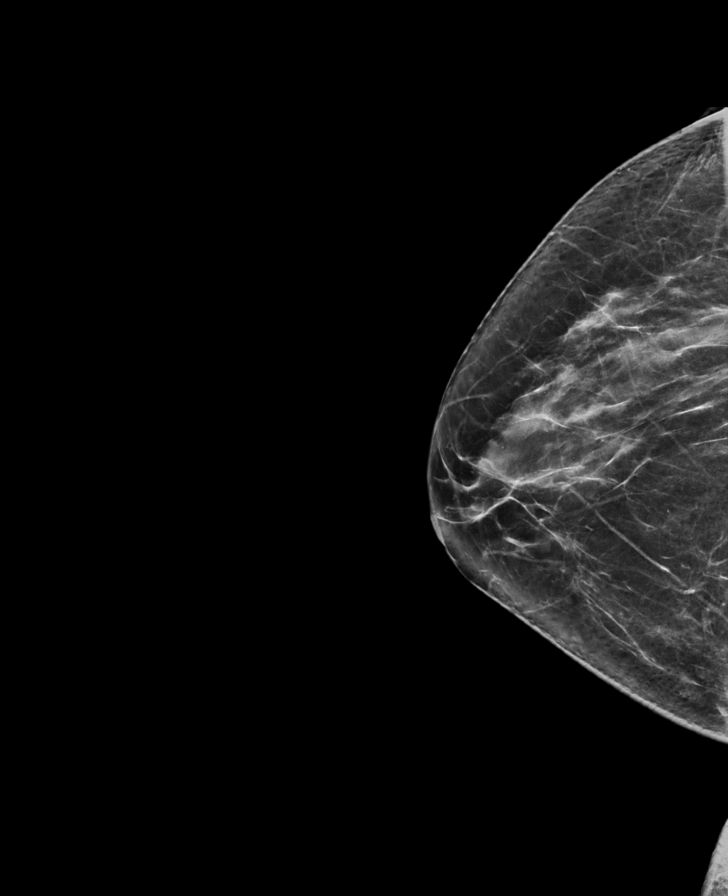

[R MLO tomo · tomo slice 41/81.0]
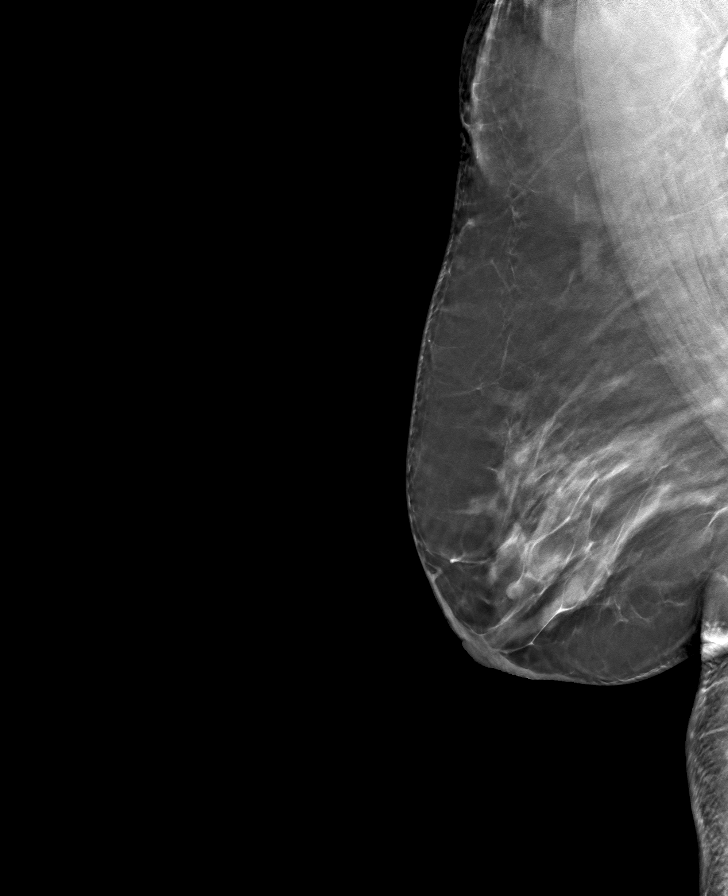

[L MLO tomo · tomo slice 41/82.0]
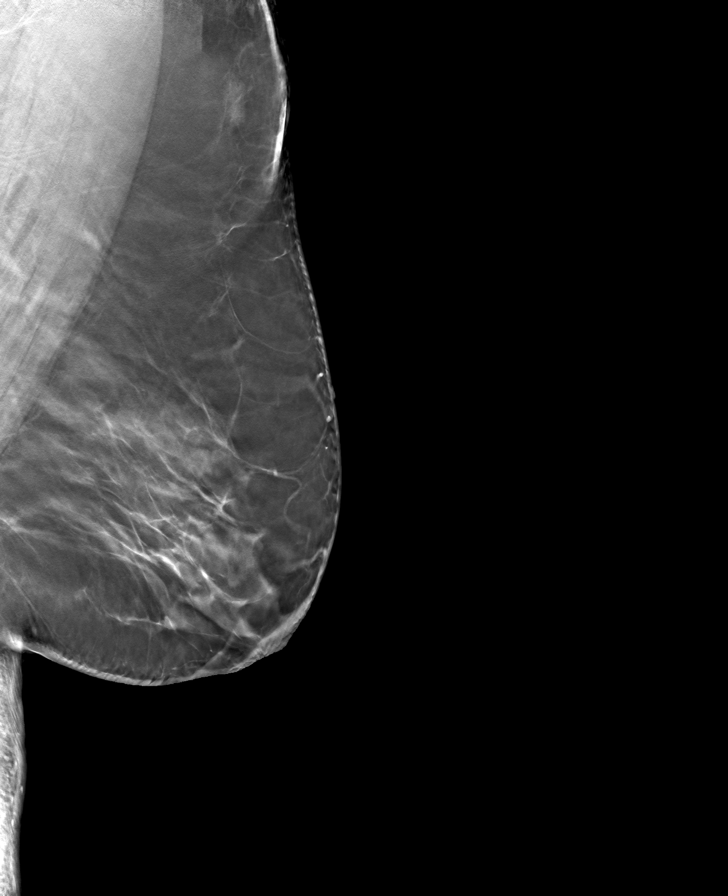

[L CC tomo · tomo slice 35/70.0]
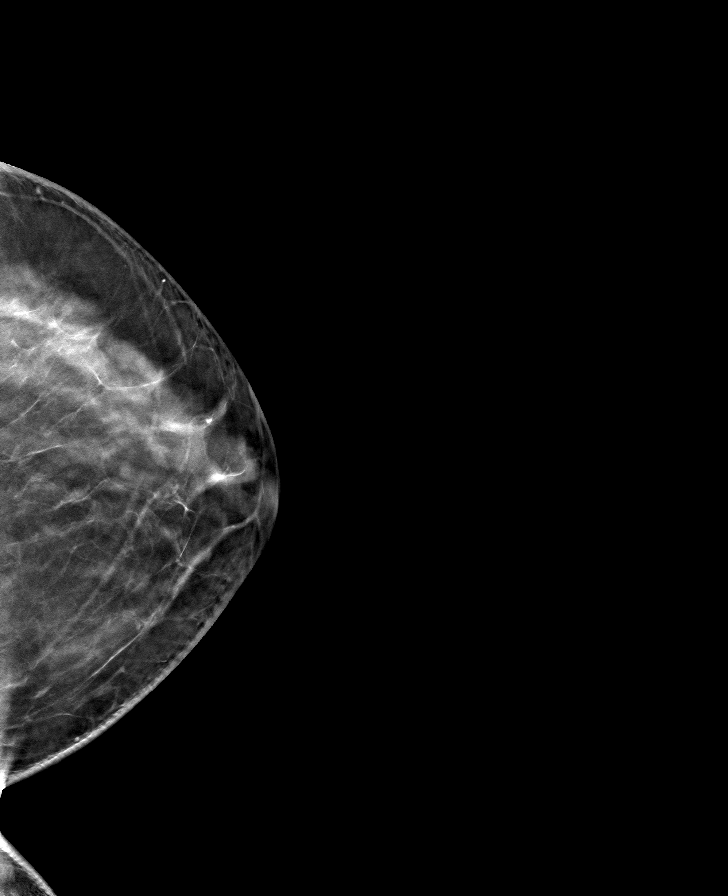

[R CC tomo · tomo slice 34/67.0]
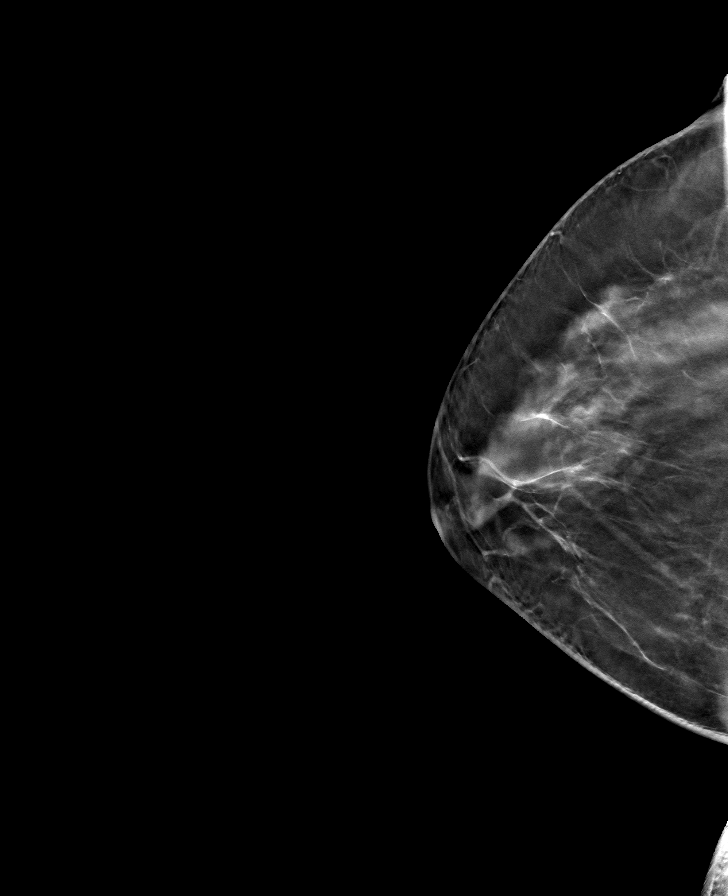

[8 of 24 positions shown; findings below may reference images not displayed]

ACR Breast Density Category c: The breast tissue is heterogeneously
dense, which may obscure small masses.
FINDINGS: There are no findings suspicious for malignancy. Images were
processed with CAD.
IMPRESSION: No mammographic evidence of malignancy. A result letter of this
screening mammogram will be mailed directly to the patient.

RECOMMENDATION:
Screening mammogram in one year. (Code:[id number])

BI-RADS CATEGORY  1: Negative.

## 2020-03-06 DIAGNOSIS — E782 Mixed hyperlipidemia: Secondary | ICD-10-CM | POA: Diagnosis not present

## 2020-03-13 DIAGNOSIS — I700 Atherosclerosis of aorta: Secondary | ICD-10-CM | POA: Diagnosis not present

## 2020-03-13 DIAGNOSIS — I2510 Atherosclerotic heart disease of native coronary artery without angina pectoris: Secondary | ICD-10-CM | POA: Diagnosis not present

## 2020-03-13 DIAGNOSIS — E6609 Other obesity due to excess calories: Secondary | ICD-10-CM | POA: Diagnosis not present

## 2020-03-13 DIAGNOSIS — I10 Essential (primary) hypertension: Secondary | ICD-10-CM | POA: Diagnosis not present

## 2020-03-13 DIAGNOSIS — E782 Mixed hyperlipidemia: Secondary | ICD-10-CM | POA: Diagnosis not present

## 2020-06-12 DIAGNOSIS — H6980 Other specified disorders of Eustachian tube, unspecified ear: Secondary | ICD-10-CM | POA: Diagnosis not present

## 2020-06-12 DIAGNOSIS — E782 Mixed hyperlipidemia: Secondary | ICD-10-CM | POA: Diagnosis not present

## 2020-07-31 ENCOUNTER — Other Ambulatory Visit: Payer: Self-pay | Admitting: Family Medicine

## 2020-07-31 DIAGNOSIS — E782 Mixed hyperlipidemia: Secondary | ICD-10-CM | POA: Diagnosis not present

## 2020-07-31 DIAGNOSIS — Z1231 Encounter for screening mammogram for malignant neoplasm of breast: Secondary | ICD-10-CM

## 2020-08-07 ENCOUNTER — Ambulatory Visit
Admission: RE | Admit: 2020-08-07 | Discharge: 2020-08-07 | Disposition: A | Discharge status: Home / Self Care | Payer: PPO | Source: Ambulatory Visit | Attending: Family Medicine | Admitting: Family Medicine

## 2020-08-07 ENCOUNTER — Other Ambulatory Visit: Payer: Self-pay

## 2020-08-07 DIAGNOSIS — J438 Other emphysema: Secondary | ICD-10-CM | POA: Diagnosis not present

## 2020-08-07 DIAGNOSIS — E6609 Other obesity due to excess calories: Secondary | ICD-10-CM | POA: Diagnosis not present

## 2020-08-07 DIAGNOSIS — Z1231 Encounter for screening mammogram for malignant neoplasm of breast: Secondary | ICD-10-CM

## 2020-08-07 DIAGNOSIS — E782 Mixed hyperlipidemia: Secondary | ICD-10-CM | POA: Diagnosis not present

## 2020-08-07 DIAGNOSIS — I2510 Atherosclerotic heart disease of native coronary artery without angina pectoris: Secondary | ICD-10-CM | POA: Diagnosis not present

## 2020-08-07 DIAGNOSIS — Z780 Asymptomatic menopausal state: Secondary | ICD-10-CM | POA: Diagnosis not present

## 2020-08-07 DIAGNOSIS — I700 Atherosclerosis of aorta: Secondary | ICD-10-CM | POA: Diagnosis not present

## 2020-08-07 DIAGNOSIS — Z1159 Encounter for screening for other viral diseases: Secondary | ICD-10-CM | POA: Diagnosis not present

## 2020-08-07 DIAGNOSIS — Z1211 Encounter for screening for malignant neoplasm of colon: Secondary | ICD-10-CM | POA: Diagnosis not present

## 2020-08-07 DIAGNOSIS — I10 Essential (primary) hypertension: Secondary | ICD-10-CM | POA: Diagnosis not present

## 2020-08-14 DIAGNOSIS — M8588 Other specified disorders of bone density and structure, other site: Secondary | ICD-10-CM | POA: Diagnosis not present

## 2020-09-04 LAB — COLOGUARD

## 2020-09-11 DIAGNOSIS — Z1211 Encounter for screening for malignant neoplasm of colon: Secondary | ICD-10-CM | POA: Diagnosis not present

## 2020-09-15 LAB — COLOGUARD: COLOGUARD: NEGATIVE

## 2020-09-15 LAB — EXTERNAL GENERIC LAB PROCEDURE: COLOGUARD: NEGATIVE

## 2020-12-04 DIAGNOSIS — E782 Mixed hyperlipidemia: Secondary | ICD-10-CM | POA: Diagnosis not present

## 2020-12-04 DIAGNOSIS — Z1159 Encounter for screening for other viral diseases: Secondary | ICD-10-CM | POA: Diagnosis not present

## 2020-12-04 DIAGNOSIS — I2510 Atherosclerotic heart disease of native coronary artery without angina pectoris: Secondary | ICD-10-CM | POA: Diagnosis not present

## 2020-12-04 DIAGNOSIS — I10 Essential (primary) hypertension: Secondary | ICD-10-CM | POA: Diagnosis not present

## 2020-12-18 DIAGNOSIS — Z0000 Encounter for general adult medical examination without abnormal findings: Secondary | ICD-10-CM | POA: Diagnosis not present

## 2020-12-18 DIAGNOSIS — E6609 Other obesity due to excess calories: Secondary | ICD-10-CM | POA: Diagnosis not present

## 2020-12-18 DIAGNOSIS — I2510 Atherosclerotic heart disease of native coronary artery without angina pectoris: Secondary | ICD-10-CM | POA: Diagnosis not present

## 2020-12-18 DIAGNOSIS — I10 Essential (primary) hypertension: Secondary | ICD-10-CM | POA: Diagnosis not present

## 2020-12-18 DIAGNOSIS — E782 Mixed hyperlipidemia: Secondary | ICD-10-CM | POA: Diagnosis not present

## 2021-02-21 DIAGNOSIS — H4011X0 Primary open-angle glaucoma, stage unspecified: Secondary | ICD-10-CM | POA: Diagnosis not present

## 2021-02-21 DIAGNOSIS — H40013 Open angle with borderline findings, low risk, bilateral: Secondary | ICD-10-CM | POA: Diagnosis not present

## 2021-03-06 DIAGNOSIS — I10 Essential (primary) hypertension: Secondary | ICD-10-CM | POA: Diagnosis not present

## 2021-03-06 DIAGNOSIS — J029 Acute pharyngitis, unspecified: Secondary | ICD-10-CM | POA: Diagnosis not present

## 2021-03-06 DIAGNOSIS — Z03818 Encounter for observation for suspected exposure to other biological agents ruled out: Secondary | ICD-10-CM | POA: Diagnosis not present

## 2021-03-06 DIAGNOSIS — R42 Dizziness and giddiness: Secondary | ICD-10-CM | POA: Diagnosis not present

## 2021-03-06 DIAGNOSIS — R059 Cough, unspecified: Secondary | ICD-10-CM | POA: Diagnosis not present

## 2021-03-06 DIAGNOSIS — J22 Unspecified acute lower respiratory infection: Secondary | ICD-10-CM | POA: Diagnosis not present

## 2021-03-06 DIAGNOSIS — R051 Acute cough: Secondary | ICD-10-CM | POA: Diagnosis not present

## 2021-04-18 DIAGNOSIS — I10 Essential (primary) hypertension: Secondary | ICD-10-CM | POA: Diagnosis not present

## 2021-04-18 DIAGNOSIS — E782 Mixed hyperlipidemia: Secondary | ICD-10-CM | POA: Diagnosis not present

## 2021-04-25 DIAGNOSIS — I700 Atherosclerosis of aorta: Secondary | ICD-10-CM | POA: Diagnosis not present

## 2021-04-25 DIAGNOSIS — R7981 Abnormal blood-gas level: Secondary | ICD-10-CM | POA: Diagnosis not present

## 2021-04-25 DIAGNOSIS — R001 Bradycardia, unspecified: Secondary | ICD-10-CM | POA: Diagnosis not present

## 2021-04-25 DIAGNOSIS — E782 Mixed hyperlipidemia: Secondary | ICD-10-CM | POA: Diagnosis not present

## 2021-04-25 DIAGNOSIS — J438 Other emphysema: Secondary | ICD-10-CM | POA: Diagnosis not present

## 2021-04-25 DIAGNOSIS — I2510 Atherosclerotic heart disease of native coronary artery without angina pectoris: Secondary | ICD-10-CM | POA: Diagnosis not present

## 2021-04-25 DIAGNOSIS — I10 Essential (primary) hypertension: Secondary | ICD-10-CM | POA: Diagnosis not present

## 2021-04-25 DIAGNOSIS — E6609 Other obesity due to excess calories: Secondary | ICD-10-CM | POA: Diagnosis not present

## 2021-05-30 DIAGNOSIS — J438 Other emphysema: Secondary | ICD-10-CM | POA: Diagnosis not present

## 2021-05-31 ENCOUNTER — Other Ambulatory Visit: Payer: Self-pay

## 2021-05-31 DIAGNOSIS — Z87891 Personal history of nicotine dependence: Secondary | ICD-10-CM

## 2021-06-11 ENCOUNTER — Ambulatory Visit
Admission: RE | Admit: 2021-06-11 | Discharge: 2021-06-11 | Disposition: A | Discharge status: Home / Self Care | Payer: PPO | Source: Ambulatory Visit | Attending: Acute Care | Admitting: Acute Care

## 2021-06-11 ENCOUNTER — Other Ambulatory Visit: Payer: Self-pay

## 2021-06-11 DIAGNOSIS — Z87891 Personal history of nicotine dependence: Secondary | ICD-10-CM

## 2021-06-13 ENCOUNTER — Other Ambulatory Visit: Payer: Self-pay | Admitting: Acute Care

## 2021-06-13 DIAGNOSIS — Z87891 Personal history of nicotine dependence: Secondary | ICD-10-CM

## 2021-06-19 DIAGNOSIS — I2510 Atherosclerotic heart disease of native coronary artery without angina pectoris: Secondary | ICD-10-CM | POA: Diagnosis not present

## 2021-06-19 DIAGNOSIS — M545 Low back pain, unspecified: Secondary | ICD-10-CM | POA: Diagnosis not present

## 2021-06-19 DIAGNOSIS — K5900 Constipation, unspecified: Secondary | ICD-10-CM | POA: Diagnosis not present

## 2021-06-19 DIAGNOSIS — I10 Essential (primary) hypertension: Secondary | ICD-10-CM | POA: Diagnosis not present

## 2021-06-20 DIAGNOSIS — M545 Low back pain, unspecified: Secondary | ICD-10-CM | POA: Diagnosis not present

## 2021-06-20 DIAGNOSIS — I10 Essential (primary) hypertension: Secondary | ICD-10-CM | POA: Diagnosis not present

## 2021-06-28 ENCOUNTER — Other Ambulatory Visit: Payer: Self-pay | Admitting: Family Medicine

## 2021-06-28 DIAGNOSIS — Z1231 Encounter for screening mammogram for malignant neoplasm of breast: Secondary | ICD-10-CM

## 2021-07-05 DIAGNOSIS — M62830 Muscle spasm of back: Secondary | ICD-10-CM | POA: Diagnosis not present

## 2021-07-05 DIAGNOSIS — M47816 Spondylosis without myelopathy or radiculopathy, lumbar region: Secondary | ICD-10-CM | POA: Diagnosis not present

## 2021-07-05 DIAGNOSIS — M545 Low back pain, unspecified: Secondary | ICD-10-CM | POA: Diagnosis not present

## 2021-07-05 DIAGNOSIS — M533 Sacrococcygeal disorders, not elsewhere classified: Secondary | ICD-10-CM | POA: Diagnosis not present

## 2021-07-05 DIAGNOSIS — M4126 Other idiopathic scoliosis, lumbar region: Secondary | ICD-10-CM | POA: Diagnosis not present

## 2021-07-05 DIAGNOSIS — M5137 Other intervertebral disc degeneration, lumbosacral region: Secondary | ICD-10-CM | POA: Diagnosis not present

## 2021-07-11 DIAGNOSIS — H40013 Open angle with borderline findings, low risk, bilateral: Secondary | ICD-10-CM | POA: Diagnosis not present

## 2021-07-25 DIAGNOSIS — J438 Other emphysema: Secondary | ICD-10-CM | POA: Diagnosis not present

## 2021-07-31 DIAGNOSIS — J449 Chronic obstructive pulmonary disease, unspecified: Secondary | ICD-10-CM | POA: Diagnosis not present

## 2021-07-31 DIAGNOSIS — M47816 Spondylosis without myelopathy or radiculopathy, lumbar region: Secondary | ICD-10-CM | POA: Diagnosis not present

## 2021-07-31 DIAGNOSIS — M545 Low back pain, unspecified: Secondary | ICD-10-CM | POA: Diagnosis not present

## 2021-08-15 ENCOUNTER — Ambulatory Visit
Admission: RE | Admit: 2021-08-15 | Discharge: 2021-08-15 | Disposition: A | Discharge status: Home / Self Care | Payer: PPO | Source: Ambulatory Visit | Attending: Family Medicine | Admitting: Family Medicine

## 2021-08-15 DIAGNOSIS — Z1231 Encounter for screening mammogram for malignant neoplasm of breast: Secondary | ICD-10-CM | POA: Diagnosis not present

## 2021-10-05 DIAGNOSIS — E782 Mixed hyperlipidemia: Secondary | ICD-10-CM | POA: Diagnosis not present

## 2021-10-12 DIAGNOSIS — E782 Mixed hyperlipidemia: Secondary | ICD-10-CM | POA: Diagnosis not present

## 2021-10-12 DIAGNOSIS — I700 Atherosclerosis of aorta: Secondary | ICD-10-CM | POA: Diagnosis not present

## 2021-10-12 DIAGNOSIS — I2510 Atherosclerotic heart disease of native coronary artery without angina pectoris: Secondary | ICD-10-CM | POA: Diagnosis not present

## 2021-10-12 DIAGNOSIS — J438 Other emphysema: Secondary | ICD-10-CM | POA: Diagnosis not present

## 2021-10-12 DIAGNOSIS — E6609 Other obesity due to excess calories: Secondary | ICD-10-CM | POA: Diagnosis not present

## 2021-10-12 DIAGNOSIS — Z8673 Personal history of transient ischemic attack (TIA), and cerebral infarction without residual deficits: Secondary | ICD-10-CM | POA: Diagnosis not present

## 2021-10-12 DIAGNOSIS — I10 Essential (primary) hypertension: Secondary | ICD-10-CM | POA: Diagnosis not present

## 2022-02-01 DIAGNOSIS — R918 Other nonspecific abnormal finding of lung field: Secondary | ICD-10-CM | POA: Diagnosis not present

## 2022-02-01 DIAGNOSIS — J4489 Other specified chronic obstructive pulmonary disease: Secondary | ICD-10-CM | POA: Diagnosis not present

## 2022-04-12 DIAGNOSIS — E782 Mixed hyperlipidemia: Secondary | ICD-10-CM | POA: Diagnosis not present

## 2022-04-12 DIAGNOSIS — I10 Essential (primary) hypertension: Secondary | ICD-10-CM | POA: Diagnosis not present

## 2022-04-12 DIAGNOSIS — I700 Atherosclerosis of aorta: Secondary | ICD-10-CM | POA: Diagnosis not present

## 2022-04-12 DIAGNOSIS — Z8673 Personal history of transient ischemic attack (TIA), and cerebral infarction without residual deficits: Secondary | ICD-10-CM | POA: Diagnosis not present

## 2022-04-26 DIAGNOSIS — Z0000 Encounter for general adult medical examination without abnormal findings: Secondary | ICD-10-CM | POA: Diagnosis not present

## 2022-04-26 DIAGNOSIS — J438 Other emphysema: Secondary | ICD-10-CM | POA: Diagnosis not present

## 2022-04-26 DIAGNOSIS — E782 Mixed hyperlipidemia: Secondary | ICD-10-CM | POA: Diagnosis not present

## 2022-04-26 DIAGNOSIS — I10 Essential (primary) hypertension: Secondary | ICD-10-CM | POA: Diagnosis not present

## 2022-04-26 DIAGNOSIS — E6609 Other obesity due to excess calories: Secondary | ICD-10-CM | POA: Diagnosis not present

## 2022-04-26 DIAGNOSIS — I2510 Atherosclerotic heart disease of native coronary artery without angina pectoris: Secondary | ICD-10-CM | POA: Diagnosis not present

## 2022-04-26 DIAGNOSIS — I700 Atherosclerosis of aorta: Secondary | ICD-10-CM | POA: Diagnosis not present

## 2022-06-06 ENCOUNTER — Other Ambulatory Visit: Payer: Self-pay | Admitting: Pulmonary Disease

## 2022-06-06 DIAGNOSIS — R918 Other nonspecific abnormal finding of lung field: Secondary | ICD-10-CM

## 2022-06-12 ENCOUNTER — Ambulatory Visit
Admission: RE | Admit: 2022-06-12 | Discharge: 2022-06-12 | Disposition: A | Discharge status: Home / Self Care | Payer: PPO | Source: Ambulatory Visit | Attending: Acute Care | Admitting: Acute Care

## 2022-06-12 DIAGNOSIS — R911 Solitary pulmonary nodule: Secondary | ICD-10-CM | POA: Diagnosis not present

## 2022-06-12 DIAGNOSIS — Z122 Encounter for screening for malignant neoplasm of respiratory organs: Secondary | ICD-10-CM | POA: Diagnosis not present

## 2022-06-12 DIAGNOSIS — I2510 Atherosclerotic heart disease of native coronary artery without angina pectoris: Secondary | ICD-10-CM | POA: Diagnosis not present

## 2022-06-12 DIAGNOSIS — J439 Emphysema, unspecified: Secondary | ICD-10-CM | POA: Diagnosis not present

## 2022-06-12 DIAGNOSIS — Z87891 Personal history of nicotine dependence: Secondary | ICD-10-CM | POA: Insufficient documentation

## 2022-06-12 DIAGNOSIS — I700 Atherosclerosis of aorta: Secondary | ICD-10-CM | POA: Insufficient documentation

## 2022-06-14 ENCOUNTER — Telehealth: Payer: Self-pay | Admitting: Acute Care

## 2022-06-14 NOTE — Telephone Encounter (Signed)
I have attempted to call the patient with the results of their  Low Dose CT Chest Lung cancer screening scan. There was no answer. I have left a HIPPA compliant VM requesting the patient call the office for the scan results. I included the office contact information in the message. We will await his return call. If no return call we will continue to call until patient is contacted.   

## 2022-06-18 ENCOUNTER — Telehealth: Payer: Self-pay | Admitting: Acute Care

## 2022-06-18 ENCOUNTER — Other Ambulatory Visit: Payer: Self-pay

## 2022-06-18 DIAGNOSIS — R911 Solitary pulmonary nodule: Secondary | ICD-10-CM

## 2022-06-18 DIAGNOSIS — Z87891 Personal history of nicotine dependence: Secondary | ICD-10-CM

## 2022-06-18 NOTE — Telephone Encounter (Signed)
Order placed for 3 months nodule follow up LDCT for June 2024.  Results/plan faxed to PCP

## 2022-06-18 NOTE — Telephone Encounter (Signed)
Pt called back immediately after I left her a message. I explained that she has a new pulmonary nodule in the left upper lobe that is 3.3 mm in size. This has not been seen previously on her screening scans. I explained that we will do a 3 month follow up scan to ensure it is stable and not growing. She is in agreement with this plan. I told her she will get a call to schedule the follow up closer to the time , it is due early 09/2022.  I also explained that there was notation of Normal caliber with moderate atherosclerotic disease. Mild coronary artery calcifications. She is currently on statin therapy.  Langley Gauss, 3 month follow up LDCT, early June 2024, and fax results to PCP with plan for follow up. Thanks so much

## 2022-06-18 NOTE — Telephone Encounter (Signed)
I have attempted to call the patient with the results of their  Low Dose CT Chest Lung cancer screening scan. There was no answer. I have left a HIPPA compliant VM requesting the patient call the office for the scan results. I included the office contact information in the message. We will await his return call. If no return call we will continue to call until patient is contacted.    New tiny nodule 3.3 mm that was not there last year. She needs a 3 month follow up. We can notify her PCP once we have spoken with her. Thanks so much.

## 2022-06-19 NOTE — Telephone Encounter (Signed)
See telephone note from 06/18/2022

## 2022-06-19 NOTE — Telephone Encounter (Signed)
See other telephone note from 06/18/22

## 2022-07-04 ENCOUNTER — Other Ambulatory Visit: Payer: Self-pay | Admitting: Family Medicine

## 2022-07-04 DIAGNOSIS — Z1231 Encounter for screening mammogram for malignant neoplasm of breast: Secondary | ICD-10-CM

## 2022-07-12 DIAGNOSIS — E782 Mixed hyperlipidemia: Secondary | ICD-10-CM | POA: Diagnosis not present

## 2022-07-19 DIAGNOSIS — E782 Mixed hyperlipidemia: Secondary | ICD-10-CM | POA: Diagnosis not present

## 2022-07-19 DIAGNOSIS — I700 Atherosclerosis of aorta: Secondary | ICD-10-CM | POA: Diagnosis not present

## 2022-07-19 DIAGNOSIS — E6609 Other obesity due to excess calories: Secondary | ICD-10-CM | POA: Diagnosis not present

## 2022-07-19 DIAGNOSIS — I10 Essential (primary) hypertension: Secondary | ICD-10-CM | POA: Diagnosis not present

## 2022-07-19 DIAGNOSIS — I2510 Atherosclerotic heart disease of native coronary artery without angina pectoris: Secondary | ICD-10-CM | POA: Diagnosis not present

## 2022-07-19 DIAGNOSIS — E871 Hypo-osmolality and hyponatremia: Secondary | ICD-10-CM | POA: Diagnosis not present

## 2022-07-19 DIAGNOSIS — N1831 Chronic kidney disease, stage 3a: Secondary | ICD-10-CM | POA: Diagnosis not present

## 2022-08-21 ENCOUNTER — Ambulatory Visit
Admission: RE | Admit: 2022-08-21 | Discharge: 2022-08-21 | Disposition: A | Discharge status: Home / Self Care | Payer: PPO | Source: Ambulatory Visit | Attending: Family Medicine | Admitting: Family Medicine

## 2022-08-21 DIAGNOSIS — Z1231 Encounter for screening mammogram for malignant neoplasm of breast: Secondary | ICD-10-CM | POA: Diagnosis not present

## 2022-09-13 ENCOUNTER — Ambulatory Visit
Admission: RE | Admit: 2022-09-13 | Discharge: 2022-09-13 | Disposition: A | Discharge status: Home / Self Care | Payer: PPO | Source: Ambulatory Visit | Attending: Acute Care | Admitting: Acute Care

## 2022-09-13 DIAGNOSIS — R911 Solitary pulmonary nodule: Secondary | ICD-10-CM | POA: Diagnosis not present

## 2022-09-13 DIAGNOSIS — Z87891 Personal history of nicotine dependence: Secondary | ICD-10-CM | POA: Diagnosis not present

## 2022-10-25 ENCOUNTER — Telehealth: Payer: Self-pay | Admitting: Acute Care

## 2022-10-25 DIAGNOSIS — Z87891 Personal history of nicotine dependence: Secondary | ICD-10-CM

## 2022-10-25 DIAGNOSIS — R911 Solitary pulmonary nodule: Secondary | ICD-10-CM

## 2022-10-25 NOTE — Telephone Encounter (Signed)
I have called the patient with the results of her low-dose screening CT.  I explained her scan was read as a lung RADS 3, as there has been a small amount of growth in the small solid left upper lobe pulmonary nodule.  It is currently measuring 3.5 mm, however it measures 3.2 mm 3 months ago. Plan will be for a 17-month short-term follow-up low-dose screening CT.  Patient is in agreement with this plan. Sherre Lain, and [place], please place order for three 49-month follow-up low-dose CT. Please fax results to PCP and let them know plan is for a 44-month follow-up.

## 2022-10-28 NOTE — Telephone Encounter (Signed)
Results/plans faxed to PCP. Order placed for 6 month nodule f/u CT.

## 2022-10-30 DIAGNOSIS — J449 Chronic obstructive pulmonary disease, unspecified: Secondary | ICD-10-CM | POA: Diagnosis not present

## 2022-10-31 DIAGNOSIS — M5126 Other intervertebral disc displacement, lumbar region: Secondary | ICD-10-CM | POA: Diagnosis not present

## 2022-10-31 DIAGNOSIS — M5489 Other dorsalgia: Secondary | ICD-10-CM | POA: Diagnosis not present

## 2022-10-31 DIAGNOSIS — I700 Atherosclerosis of aorta: Secondary | ICD-10-CM | POA: Diagnosis not present

## 2022-10-31 DIAGNOSIS — M47816 Spondylosis without myelopathy or radiculopathy, lumbar region: Secondary | ICD-10-CM | POA: Diagnosis not present

## 2022-11-13 DIAGNOSIS — J449 Chronic obstructive pulmonary disease, unspecified: Secondary | ICD-10-CM | POA: Diagnosis not present

## 2022-12-24 DIAGNOSIS — J40 Bronchitis, not specified as acute or chronic: Secondary | ICD-10-CM | POA: Diagnosis not present

## 2022-12-24 DIAGNOSIS — Z03818 Encounter for observation for suspected exposure to other biological agents ruled out: Secondary | ICD-10-CM | POA: Diagnosis not present

## 2022-12-24 DIAGNOSIS — N1831 Chronic kidney disease, stage 3a: Secondary | ICD-10-CM | POA: Diagnosis not present

## 2022-12-24 DIAGNOSIS — U071 COVID-19: Secondary | ICD-10-CM | POA: Diagnosis not present

## 2022-12-24 DIAGNOSIS — J438 Other emphysema: Secondary | ICD-10-CM | POA: Diagnosis not present

## 2023-01-15 DIAGNOSIS — E782 Mixed hyperlipidemia: Secondary | ICD-10-CM | POA: Diagnosis not present

## 2023-01-15 DIAGNOSIS — I2510 Atherosclerotic heart disease of native coronary artery without angina pectoris: Secondary | ICD-10-CM | POA: Diagnosis not present

## 2023-01-15 DIAGNOSIS — I10 Essential (primary) hypertension: Secondary | ICD-10-CM | POA: Diagnosis not present

## 2023-01-15 DIAGNOSIS — I700 Atherosclerosis of aorta: Secondary | ICD-10-CM | POA: Diagnosis not present

## 2023-01-22 DIAGNOSIS — N1831 Chronic kidney disease, stage 3a: Secondary | ICD-10-CM | POA: Diagnosis not present

## 2023-01-22 DIAGNOSIS — M545 Low back pain, unspecified: Secondary | ICD-10-CM | POA: Diagnosis not present

## 2023-01-22 DIAGNOSIS — E6609 Other obesity due to excess calories: Secondary | ICD-10-CM | POA: Diagnosis not present

## 2023-01-22 DIAGNOSIS — I2510 Atherosclerotic heart disease of native coronary artery without angina pectoris: Secondary | ICD-10-CM | POA: Diagnosis not present

## 2023-01-22 DIAGNOSIS — I10 Essential (primary) hypertension: Secondary | ICD-10-CM | POA: Diagnosis not present

## 2023-01-22 DIAGNOSIS — Z862 Personal history of diseases of the blood and blood-forming organs and certain disorders involving the immune mechanism: Secondary | ICD-10-CM | POA: Diagnosis not present

## 2023-01-22 DIAGNOSIS — E871 Hypo-osmolality and hyponatremia: Secondary | ICD-10-CM | POA: Diagnosis not present

## 2023-01-22 DIAGNOSIS — E782 Mixed hyperlipidemia: Secondary | ICD-10-CM | POA: Diagnosis not present

## 2023-03-19 ENCOUNTER — Ambulatory Visit
Admission: RE | Admit: 2023-03-19 | Discharge: 2023-03-19 | Disposition: A | Discharge status: Home / Self Care | Payer: PPO | Source: Ambulatory Visit | Attending: Acute Care | Admitting: Acute Care

## 2023-03-19 DIAGNOSIS — R911 Solitary pulmonary nodule: Secondary | ICD-10-CM | POA: Insufficient documentation

## 2023-03-19 DIAGNOSIS — Z87891 Personal history of nicotine dependence: Secondary | ICD-10-CM | POA: Insufficient documentation

## 2023-04-03 ENCOUNTER — Other Ambulatory Visit: Payer: Self-pay

## 2023-04-03 DIAGNOSIS — R911 Solitary pulmonary nodule: Secondary | ICD-10-CM

## 2023-04-03 DIAGNOSIS — Z122 Encounter for screening for malignant neoplasm of respiratory organs: Secondary | ICD-10-CM

## 2023-04-03 DIAGNOSIS — Z87891 Personal history of nicotine dependence: Secondary | ICD-10-CM

## 2023-04-22 DIAGNOSIS — Z862 Personal history of diseases of the blood and blood-forming organs and certain disorders involving the immune mechanism: Secondary | ICD-10-CM | POA: Diagnosis not present

## 2023-04-22 DIAGNOSIS — E782 Mixed hyperlipidemia: Secondary | ICD-10-CM | POA: Diagnosis not present

## 2023-04-30 DIAGNOSIS — E782 Mixed hyperlipidemia: Secondary | ICD-10-CM | POA: Diagnosis not present

## 2023-04-30 DIAGNOSIS — E6609 Other obesity due to excess calories: Secondary | ICD-10-CM | POA: Diagnosis not present

## 2023-04-30 DIAGNOSIS — I2510 Atherosclerotic heart disease of native coronary artery without angina pectoris: Secondary | ICD-10-CM | POA: Diagnosis not present

## 2023-04-30 DIAGNOSIS — I700 Atherosclerosis of aorta: Secondary | ICD-10-CM | POA: Diagnosis not present

## 2023-04-30 DIAGNOSIS — I10 Essential (primary) hypertension: Secondary | ICD-10-CM | POA: Diagnosis not present

## 2023-04-30 DIAGNOSIS — Z0000 Encounter for general adult medical examination without abnormal findings: Secondary | ICD-10-CM | POA: Diagnosis not present

## 2023-06-16 DIAGNOSIS — D2271 Melanocytic nevi of right lower limb, including hip: Secondary | ICD-10-CM | POA: Diagnosis not present

## 2023-06-16 DIAGNOSIS — R238 Other skin changes: Secondary | ICD-10-CM | POA: Diagnosis not present

## 2023-06-16 DIAGNOSIS — L538 Other specified erythematous conditions: Secondary | ICD-10-CM | POA: Diagnosis not present

## 2023-06-16 DIAGNOSIS — B078 Other viral warts: Secondary | ICD-10-CM | POA: Diagnosis not present

## 2023-06-16 DIAGNOSIS — D2261 Melanocytic nevi of right upper limb, including shoulder: Secondary | ICD-10-CM | POA: Diagnosis not present

## 2023-06-16 DIAGNOSIS — D2272 Melanocytic nevi of left lower limb, including hip: Secondary | ICD-10-CM | POA: Diagnosis not present

## 2023-06-16 DIAGNOSIS — R208 Other disturbances of skin sensation: Secondary | ICD-10-CM | POA: Diagnosis not present

## 2023-06-16 DIAGNOSIS — D225 Melanocytic nevi of trunk: Secondary | ICD-10-CM | POA: Diagnosis not present

## 2023-06-16 DIAGNOSIS — D2262 Melanocytic nevi of left upper limb, including shoulder: Secondary | ICD-10-CM | POA: Diagnosis not present

## 2023-06-16 DIAGNOSIS — L821 Other seborrheic keratosis: Secondary | ICD-10-CM | POA: Diagnosis not present

## 2023-07-21 ENCOUNTER — Other Ambulatory Visit: Payer: Self-pay | Admitting: Family Medicine

## 2023-07-21 DIAGNOSIS — Z1231 Encounter for screening mammogram for malignant neoplasm of breast: Secondary | ICD-10-CM

## 2023-07-23 DIAGNOSIS — E782 Mixed hyperlipidemia: Secondary | ICD-10-CM | POA: Diagnosis not present

## 2023-07-30 DIAGNOSIS — I700 Atherosclerosis of aorta: Secondary | ICD-10-CM | POA: Diagnosis not present

## 2023-07-30 DIAGNOSIS — J438 Other emphysema: Secondary | ICD-10-CM | POA: Diagnosis not present

## 2023-07-30 DIAGNOSIS — I2510 Atherosclerotic heart disease of native coronary artery without angina pectoris: Secondary | ICD-10-CM | POA: Diagnosis not present

## 2023-07-30 DIAGNOSIS — E6609 Other obesity due to excess calories: Secondary | ICD-10-CM | POA: Diagnosis not present

## 2023-07-30 DIAGNOSIS — I10 Essential (primary) hypertension: Secondary | ICD-10-CM | POA: Diagnosis not present

## 2023-07-30 DIAGNOSIS — E782 Mixed hyperlipidemia: Secondary | ICD-10-CM | POA: Diagnosis not present

## 2023-08-27 ENCOUNTER — Ambulatory Visit
Admission: RE | Admit: 2023-08-27 | Discharge: 2023-08-27 | Disposition: A | Discharge status: Home / Self Care | Source: Ambulatory Visit | Attending: Family Medicine | Admitting: Family Medicine

## 2023-08-27 DIAGNOSIS — Z1231 Encounter for screening mammogram for malignant neoplasm of breast: Secondary | ICD-10-CM | POA: Insufficient documentation

## 2023-12-13 DIAGNOSIS — R350 Frequency of micturition: Secondary | ICD-10-CM | POA: Diagnosis not present

## 2023-12-13 DIAGNOSIS — R1031 Right lower quadrant pain: Secondary | ICD-10-CM | POA: Diagnosis not present

## 2024-01-06 DIAGNOSIS — S39012D Strain of muscle, fascia and tendon of lower back, subsequent encounter: Secondary | ICD-10-CM | POA: Diagnosis not present

## 2024-01-12 ENCOUNTER — Other Ambulatory Visit: Payer: Self-pay

## 2024-01-12 ENCOUNTER — Emergency Department
Admission: EM | Admit: 2024-01-12 | Discharge: 2024-01-12 | Disposition: A | Discharge status: Home / Self Care | Attending: Emergency Medicine | Admitting: Emergency Medicine

## 2024-01-12 DIAGNOSIS — M549 Dorsalgia, unspecified: Secondary | ICD-10-CM | POA: Diagnosis present

## 2024-01-12 DIAGNOSIS — G8929 Other chronic pain: Secondary | ICD-10-CM | POA: Diagnosis not present

## 2024-01-12 DIAGNOSIS — M5441 Lumbago with sciatica, right side: Secondary | ICD-10-CM | POA: Diagnosis not present

## 2024-01-12 DIAGNOSIS — T50905A Adverse effect of unspecified drugs, medicaments and biological substances, initial encounter: Secondary | ICD-10-CM

## 2024-01-12 DIAGNOSIS — T380X5A Adverse effect of glucocorticoids and synthetic analogues, initial encounter: Secondary | ICD-10-CM | POA: Diagnosis not present

## 2024-01-12 NOTE — ED Triage Notes (Signed)
 First nurse note: Lower right sided back and flank. Pt sent by [facility].

## 2024-01-12 NOTE — ED Triage Notes (Signed)
 Pt states outpt medication made her shaky and forgetful. Only mild relief from the pain

## 2024-01-14 NOTE — ED Provider Notes (Signed)
 [facility] Provider Note   Event Date/Time   First MD Initiated Contact with Patient 01/12/24 1349     (approximate) History  Back Pain  HPI Allison Andrews is a 73 y.o. female recently placed on prednisone for chronic back pain who presents complaining of shakiness, increased appetite, agitation, and insomnia.  Patient states that she has had steroid medications in the past but has never had the side effects and is concerned that it may be from a different medication that she was on.  Patient was also given baclofen and tramadol.  Patient was recently given a course of of Norco and cyclobenzaprine which did not react with her the same way. ROS: Patient currently denies any vision changes, tinnitus, difficulty speaking, facial droop, sore throat, chest pain, shortness of breath, abdominal pain, nausea/vomiting/diarrhea, dysuria, or weakness/numbness/paresthesias in any extremity   Physical Exam  Triage Vital Signs: ED Triage Vitals  Encounter Vitals Group     BP 01/12/24 1301 (!) 169/75     Girls Systolic BP Percentile --      Girls Diastolic BP Percentile --      Boys Systolic BP Percentile --      Boys Diastolic BP Percentile --      Pulse Rate 01/12/24 1301 64     Resp 01/12/24 1301 16     Temp 01/12/24 1301 98.2 F (36.8 C)     Temp Source 01/12/24 1301 Oral     SpO2 01/12/24 1301 99 %     Weight --      Height --      Head Circumference --      Peak Flow --      Pain Score 01/12/24 1302 4     Pain Loc --      Pain Education --      Exclude from Growth Chart --    Most recent vital signs: Vitals:   01/12/24 1301 01/12/24 1444  BP: (!) 169/75   Pulse: 64   Resp: 16   Temp: 98.2 F (36.8 C)   SpO2: 99% 99%   General: Awake, oriented x4. CV:  Good peripheral perfusion. Resp:  Normal effort. Abd:  No distention. Other:  Elderly obese Caucasian female resting comfortably in no acute distress ED Results / Procedures / Treatments   Labs (all labs ordered are listed, but only abnormal results are displayed) Labs Reviewed - No data to display PROCEDURES: Critical Care performed: No Procedures MEDICATIONS ORDERED IN ED: Medications - No data to display IMPRESSION / MDM / ASSESSMENT AND PLAN / ED COURSE  I reviewed the triage vital signs and the nursing notes.                             The patient is on the cardiac monitor to evaluate for evidence of arrhythmia and/or significant heart rate changes. Patient's presentation is most consistent with acute presentation with potential threat to life or bodily function. Patient is 73 year old female with above-stated past medical history who presents for constellation of symptoms after starting a medication regimen including oral steroids.  Patient has had similar medication regimen without the steroids and did not have the side effects and therefore I believe that they are likely secondary to this prednisone course.  Patient has been off his prednisone for the last 2 days and states that symptoms are starting to improve.  Patient was encouraged to tell her future physicians  that she does not respond well to the steroid.  Patient agrees with plan for discharge at this time and follow-up as needed.  Patient given strict return precautions and all questions answered prior to discharge  Dispo: Discharge home with PCP follow-up   FINAL CLINICAL IMPRESSION(S) / ED DIAGNOSES   Final diagnoses:  Chronic right-sided low back pain with right-sided sciatica  Medication side effect, initial encounter   Rx / DC Orders   ED Discharge Orders     None      Note:  This document was prepared using Dragon voice recognition software and may include unintentional dictation errors.   Adarsh Mundorf K, MD 01/14/24 [phone]

## 2024-01-16 DIAGNOSIS — E782 Mixed hyperlipidemia: Secondary | ICD-10-CM | POA: Diagnosis not present

## 2024-01-20 DIAGNOSIS — M545 Low back pain, unspecified: Secondary | ICD-10-CM | POA: Diagnosis not present

## 2024-01-20 DIAGNOSIS — Z789 Other specified health status: Secondary | ICD-10-CM | POA: Diagnosis not present

## 2024-02-03 ENCOUNTER — Ambulatory Visit: Admitting: Physician Assistant

## 2024-02-26 NOTE — Progress Notes (Signed)
 Cambrie Sonnenfeld                                          MRN: [id number]   02/26/2024   The [facility] Quality Team Specialist reviewed this patient medical record for the purposes of chart review for care gap closure. The following were reviewed: abstraction for care gap closure-controlling blood pressure.    [facility]I Quality Team

## 2024-04-15 ENCOUNTER — Other Ambulatory Visit: Payer: Self-pay | Admitting: Pulmonary Disease

## 2024-04-15 DIAGNOSIS — R0602 Shortness of breath: Secondary | ICD-10-CM

## 2024-04-15 DIAGNOSIS — F17210 Nicotine dependence, cigarettes, uncomplicated: Secondary | ICD-10-CM

## 2024-05-19 ENCOUNTER — Ambulatory Visit
